# Patient Record
Sex: Female | Born: 1955 | Race: White | Hispanic: No | Marital: Single | Smoking: Current every day smoker
Health system: Southern US, Community
[De-identification: ages and names within clinical notes are randomized; demographics above are authoritative.]

## PROBLEM LIST (undated history)

## (undated) DIAGNOSIS — Z973 Presence of spectacles and contact lenses: Secondary | ICD-10-CM

## (undated) DIAGNOSIS — I10 Essential (primary) hypertension: Secondary | ICD-10-CM

## (undated) DIAGNOSIS — C801 Malignant (primary) neoplasm, unspecified: Secondary | ICD-10-CM

## (undated) HISTORY — PX: CHOLECYSTECTOMY: SHX55

## (undated) HISTORY — PX: ABDOMINAL HYSTERECTOMY: SHX81

## (undated) HISTORY — PX: NASAL SINUS SURGERY: SHX719

---

## 2010-12-15 DIAGNOSIS — I10 Essential (primary) hypertension: Secondary | ICD-10-CM | POA: Insufficient documentation

## 2010-12-15 DIAGNOSIS — D539 Nutritional anemia, unspecified: Secondary | ICD-10-CM | POA: Insufficient documentation

## 2010-12-15 DIAGNOSIS — IMO0002 Reserved for concepts with insufficient information to code with codable children: Secondary | ICD-10-CM | POA: Insufficient documentation

## 2010-12-15 DIAGNOSIS — M8580 Other specified disorders of bone density and structure, unspecified site: Secondary | ICD-10-CM | POA: Insufficient documentation

## 2011-05-24 ENCOUNTER — Ambulatory Visit: Payer: Self-pay

## 2011-12-09 ENCOUNTER — Ambulatory Visit: Payer: Self-pay | Admitting: Family Medicine

## 2015-11-03 ENCOUNTER — Encounter
Admission: RE | Disposition: A | Discharge status: Home Health | Payer: Self-pay | Source: Ambulatory Visit | Attending: Unknown Physician Specialty

## 2015-11-03 ENCOUNTER — Ambulatory Visit: Payer: 59 | Admitting: Anesthesiology

## 2015-11-03 ENCOUNTER — Encounter: Payer: Self-pay | Admitting: *Deleted

## 2015-11-03 ENCOUNTER — Ambulatory Visit
Admission: RE | Admit: 2015-11-03 | Discharge: 2015-11-03 | Disposition: A | Discharge status: Home Health | Payer: 59 | Source: Ambulatory Visit | Attending: Unknown Physician Specialty | Admitting: Unknown Physician Specialty

## 2015-11-03 DIAGNOSIS — K635 Polyp of colon: Secondary | ICD-10-CM | POA: Diagnosis not present

## 2015-11-03 DIAGNOSIS — K640 First degree hemorrhoids: Secondary | ICD-10-CM | POA: Insufficient documentation

## 2015-11-03 DIAGNOSIS — Z9049 Acquired absence of other specified parts of digestive tract: Secondary | ICD-10-CM | POA: Diagnosis not present

## 2015-11-03 DIAGNOSIS — D124 Benign neoplasm of descending colon: Secondary | ICD-10-CM | POA: Diagnosis not present

## 2015-11-03 DIAGNOSIS — Z800 Family history of malignant neoplasm of digestive organs: Secondary | ICD-10-CM | POA: Diagnosis present

## 2015-11-03 DIAGNOSIS — Z90710 Acquired absence of both cervix and uterus: Secondary | ICD-10-CM | POA: Insufficient documentation

## 2015-11-03 DIAGNOSIS — F17210 Nicotine dependence, cigarettes, uncomplicated: Secondary | ICD-10-CM | POA: Insufficient documentation

## 2015-11-03 DIAGNOSIS — I10 Essential (primary) hypertension: Secondary | ICD-10-CM | POA: Diagnosis not present

## 2015-11-03 DIAGNOSIS — Z86010 Personal history of colonic polyps: Secondary | ICD-10-CM | POA: Insufficient documentation

## 2015-11-03 DIAGNOSIS — Z1211 Encounter for screening for malignant neoplasm of colon: Secondary | ICD-10-CM | POA: Insufficient documentation

## 2015-11-03 DIAGNOSIS — Z9889 Other specified postprocedural states: Secondary | ICD-10-CM | POA: Diagnosis not present

## 2015-11-03 DIAGNOSIS — Z7984 Long term (current) use of oral hypoglycemic drugs: Secondary | ICD-10-CM | POA: Diagnosis not present

## 2015-11-03 HISTORY — DX: Essential (primary) hypertension: I10

## 2015-11-03 HISTORY — PX: COLONOSCOPY WITH PROPOFOL: SHX5780

## 2015-11-03 SURGERY — COLONOSCOPY WITH PROPOFOL
Anesthesia: General

## 2015-11-03 MED ORDER — PROPOFOL 500 MG/50ML IV EMUL
INTRAVENOUS | Status: DC | PRN
  Administered 2015-11-03: 130 ug/kg/min via INTRAVENOUS

## 2015-11-03 MED ORDER — SODIUM CHLORIDE 0.9 % IV SOLN: INTRAVENOUS | Status: DC

## 2015-11-03 MED ORDER — MIDAZOLAM HCL 2 MG/2ML IJ SOLN
INTRAMUSCULAR | Status: DC | PRN
  Administered 2015-11-03: 1 mg via INTRAVENOUS

## 2015-11-03 MED ORDER — SODIUM CHLORIDE 0.9 % IV SOLN
INTRAVENOUS | Status: DC
  Administered 2015-11-03: 13:00:00 via INTRAVENOUS

## 2015-11-03 MED ORDER — LIDOCAINE HCL (CARDIAC) 20 MG/ML IV SOLN
INTRAVENOUS | Status: DC | PRN
  Administered 2015-11-03: 30 mg via INTRAVENOUS

## 2015-11-03 MED ORDER — PROPOFOL 10 MG/ML IV BOLUS
INTRAVENOUS | Status: DC | PRN
  Administered 2015-11-03: 30 mg via INTRAVENOUS
  Administered 2015-11-03: 20 mg via INTRAVENOUS
  Administered 2015-11-03: 30 mg via INTRAVENOUS
  Administered 2015-11-03 (×2): 20 mg via INTRAVENOUS

## 2015-11-03 NOTE — Transfer of Care (Signed)
Immediate Anesthesia Transfer of Care Note  Patient: Vanessa Dougherty  Procedure(s) Performed: Procedure(s): COLONOSCOPY WITH PROPOFOL (N/A)  Patient Location: PACU  Anesthesia Type:General  Level of Consciousness: awake and alert   Airway & Oxygen Therapy: Patient Spontanous Breathing and Patient connected to nasal cannula oxygen  Post-op Assessment: Report given to RN and Post -op Vital signs reviewed and stable  Post vital signs: Reviewed and stable  Last Vitals:  Filed Vitals:   11/03/15 1234 11/03/15 1415  BP: 120/80 116/85  Pulse: 80 66  Temp: 35.9 C 35.6 C  Resp: 20 23    Last Pain: There were no vitals filed for this visit.       Complications: No apparent anesthesia complications

## 2015-11-03 NOTE — H&P (Signed)
   Primary Care Physician:  [place] Primary Gastroenterologist:  Dr. Vira Agar  Pre-Procedure History & Physical: HPI:  Vanessa Dougherty is a 60 y.o. female is here for an colonoscopy.   Past Medical History  Diagnosis Date  . Hypertension     Past Surgical History  Procedure Laterality Date  . Abdominal hysterectomy    . Cholecystectomy    . Nasal sinus surgery      Prior to Admission medications   Medication Sig Start Date End Date Taking? Authorizing Provider  valsartan-hydrochlorothiazide (DIOVAN-HCT) 80-12.5 MG tablet Take 1 tablet by mouth daily.   Yes Historical Provider, MD    Allergies as of 10/22/2015  . (Not on File)    History reviewed. No pertinent family history.  Social History   Social History  . Marital Status: Single    Spouse Name: N/A  . Number of Children: N/A  . Years of Education: N/A   Occupational History  . Not on file.   Social History Main Topics  . Smoking status: Current Every Day Smoker -- 0.50 packs/day for 39 years    Types: Cigarettes  . Smokeless tobacco: Not on file  . Alcohol Use: 1.8 oz/week    3 Glasses of wine per week  . Drug Use: No  . Sexual Activity: Not on file   Other Topics Concern  . Not on file   Social History Narrative  . No narrative on file    Review of Systems: See HPI, otherwise negative ROS  Physical Exam: BP 120/80 mmHg  Pulse 80  Temp(Src) 96.6 F (35.9 C) (Tympanic)  Resp 20  Ht 5\' 5"  (1.651 m)  Wt 71.668 kg (158 lb)  BMI 26.29 kg/m2  SpO2 98% General:   Alert,  pleasant and cooperative in NAD Head:  Normocephalic and atraumatic. Neck:  Supple; no masses or thyromegaly. Lungs:  Clear throughout to auscultation.    Heart:  Regular rate and rhythm. Abdomen:  Soft, nontender and nondistended. Normal bowel sounds, without guarding, and without rebound.   Neurologic:  Alert and  oriented x4;  grossly normal neurologically.  Impression/Plan: Vanessa Dougherty is here for  an colonoscopy to be performed for [facility] colon polyps and FH colon cancer in father  Risks, benefits, limitations, and alternatives regarding  colonoscopy have been reviewed with the patient.  Questions have been answered.  All parties agreeable.   Gaylyn Cheers, MD  11/03/2015, 1:27 PM

## 2015-11-03 NOTE — Anesthesia Preprocedure Evaluation (Signed)
Anesthesia Evaluation  Patient identified by MRN, date of birth, ID band Patient awake    Reviewed: Allergy & Precautions, H&P , NPO status , Patient's Chart, lab work & pertinent test results, reviewed documented beta blocker date and time   Airway Mallampati: II   Neck ROM: full    Dental  (+) Poor Dentition   Pulmonary neg pulmonary ROS, Current Smoker,    Pulmonary exam normal        Cardiovascular hypertension, negative cardio ROS Normal cardiovascular exam     Neuro/Psych negative neurological ROS  negative psych ROS   GI/Hepatic negative GI ROS, Neg liver ROS,   Endo/Other  negative endocrine ROS  Renal/GU negative Renal ROS  negative genitourinary   Musculoskeletal   Abdominal   Peds  Hematology negative hematology ROS (+)   Anesthesia Other Findings Past Medical History:   Hypertension                                               Past Surgical History:   ABDOMINAL HYSTERECTOMY                                        CHOLECYSTECTOMY                                               NASAL SINUS SURGERY                                         BMI    Body Mass Index   26.29 kg/m 2     Reproductive/Obstetrics                             Anesthesia Physical Anesthesia Plan  ASA: II  Anesthesia Plan: General   Post-op Pain Management:    Induction:   Airway Management Planned:   Additional Equipment:   Intra-op Plan:   Post-operative Plan:   Informed Consent: I have reviewed the patients History and Physical, chart, labs and discussed the procedure including the risks, benefits and alternatives for the proposed anesthesia with the patient or authorized representative who has indicated his/her understanding and acceptance.   Dental Advisory Given  Plan Discussed with: CRNA  Anesthesia Plan Comments:         Anesthesia Quick Evaluation

## 2015-11-03 NOTE — Op Note (Signed)
[facility] Gastroenterology Patient Name: Vanessa Dougherty Procedure Date: 11/03/2015 1:27 PM MRN: [id number] Account #: [id number] Date of Birth: [birth year] Admit Type: Outpatient Age: 60 Room: [facility] ENDO ROOM 1 Gender: Female Note Status: Finalized Procedure:            Colonoscopy Indications:          High risk colon cancer surveillance: Personal history                        of colonic polyps, Family history of colon cancer in a                        first-degree relative Providers:            Manya Silvas, MD Referring MD:         Wynona Canes. Kym Groom, MD (Referring MD) Medicines:            Propofol per Anesthesia Complications:        No immediate complications. Procedure:            Pre-Anesthesia Assessment:                       - After reviewing the risks and benefits, the patient                        was deemed in satisfactory condition to undergo the                        procedure.                       After obtaining informed consent, the colonoscope was                        passed under direct vision. Throughout the procedure,                        the patient's blood pressure, pulse, and oxygen                        saturations were monitored continuously. The                        Colonoscope was introduced through the anus and                        advanced to the the cecum, identified by appendiceal                        orifice and ileocecal valve. The colonoscopy was                        performed with moderate difficulty due to restricted                        mobility of the colon, significant looping and a                        tortuous colon. Successful completion of the procedure  was aided by applying abdominal pressure. The patient                        tolerated the procedure well. The quality of the bowel                        preparation was excellent. Findings:      A small black nevus seen  on buttock.      A diminutive polyp was found in the recto-sigmoid colon. The polyp was       sessile. The polyp was removed with a jumbo cold forceps. Resection and       retrieval were complete.      Two sessile polyps were found in the distal descending colon. The polyps       were diminutive in size. These polyps were removed with a jumbo cold       forceps. Resection and retrieval were complete.      Two sessile polyps were found in the sigmoid colon. The polyps were       diminutive in size. These polyps were removed with a jumbo cold forceps.       Resection and retrieval were complete.      A small polyp was found in the recto-sigmoid colon. The polyp was       sessile. The polyp was removed with a hot snare. Resection and retrieval       were complete.      Internal hemorrhoids were found during endoscopy. The hemorrhoids were       small and Grade I (internal hemorrhoids that do not prolapse). Impression:           - One diminutive polyp at the recto-sigmoid colon,                        removed with a jumbo cold forceps. Resected and                        retrieved.                       - Two diminutive polyps in the distal descending colon,                        removed with a jumbo cold forceps. Resected and                        retrieved.                       - Two diminutive polyps in the sigmoid colon, removed                        with a jumbo cold forceps. Resected and retrieved.                       - One small polyp at the recto-sigmoid colon, removed                        with a hot snare. Resected and retrieved.                       - Internal hemorrhoids. Recommendation:       -  Await pathology results. Manya Silvas, MD 11/03/2015 2:15:04 PM This report has been signed electronically. Number of Addenda: 0 Note Initiated On: 11/03/2015 1:27 PM Scope Withdrawal Time: 0 hours 18 minutes 24 seconds  Total Procedure Duration: 0 hours 36 minutes 32 seconds        [facility]

## 2015-11-03 NOTE — Anesthesia Procedure Notes (Signed)
Date/Time: 11/03/2015 1:29 PM Performed by: Johnna Acosta Pre-anesthesia Checklist: Patient identified, Emergency Drugs available, Suction available, Patient being monitored and Timeout performed Patient Re-evaluated:Patient Re-evaluated prior to inductionOxygen Delivery Method: Nasal cannula

## 2015-11-04 ENCOUNTER — Encounter: Payer: Self-pay | Admitting: Unknown Physician Specialty

## 2015-11-05 LAB — SURGICAL PATHOLOGY

## 2015-11-05 NOTE — Anesthesia Postprocedure Evaluation (Signed)
Anesthesia Post Note  Patient: Vanessa Dougherty  Procedure(s) Performed: Procedure(s) (LRB): COLONOSCOPY WITH PROPOFOL (N/A)  Patient location during evaluation: PACU Anesthesia Type: General Level of consciousness: awake and alert and oriented Pain management: pain level controlled Vital Signs Assessment: post-procedure vital signs reviewed and stable Respiratory status: spontaneous breathing Cardiovascular status: blood pressure returned to baseline Anesthetic complications: no    Last Vitals:  Filed Vitals:   11/03/15 1436 11/03/15 1446  BP: 118/80 110/83  Pulse: 61 60  Temp:    Resp: 9 14    Last Pain: There were no vitals filed for this visit.               Vera Wishart

## 2016-10-28 ENCOUNTER — Other Ambulatory Visit: Payer: Self-pay | Admitting: Family Medicine

## 2016-10-28 DIAGNOSIS — Z1231 Encounter for screening mammogram for malignant neoplasm of breast: Secondary | ICD-10-CM

## 2016-11-02 ENCOUNTER — Other Ambulatory Visit: Payer: Self-pay | Admitting: Family Medicine

## 2016-11-02 ENCOUNTER — Ambulatory Visit
Admission: RE | Admit: 2016-11-02 | Discharge: 2016-11-02 | Disposition: A | Discharge status: Home / Self Care | Payer: 59 | Source: Ambulatory Visit | Attending: Family Medicine | Admitting: Family Medicine

## 2016-11-02 DIAGNOSIS — Z1231 Encounter for screening mammogram for malignant neoplasm of breast: Secondary | ICD-10-CM | POA: Insufficient documentation

## 2016-11-02 HISTORY — DX: Malignant (primary) neoplasm, unspecified: C80.1

## 2016-11-03 ENCOUNTER — Other Ambulatory Visit: Payer: Self-pay | Admitting: *Deleted

## 2016-11-03 ENCOUNTER — Inpatient Hospital Stay
Admission: RE | Admit: 2016-11-03 | Discharge: 2016-11-03 | Disposition: A | Discharge status: Home / Self Care | Payer: Self-pay | Source: Ambulatory Visit | Attending: *Deleted | Admitting: *Deleted

## 2016-11-03 DIAGNOSIS — Z9289 Personal history of other medical treatment: Secondary | ICD-10-CM

## 2016-11-05 ENCOUNTER — Other Ambulatory Visit: Payer: Self-pay | Admitting: Family Medicine

## 2016-11-05 DIAGNOSIS — R921 Mammographic calcification found on diagnostic imaging of breast: Secondary | ICD-10-CM

## 2016-11-05 DIAGNOSIS — R928 Other abnormal and inconclusive findings on diagnostic imaging of breast: Secondary | ICD-10-CM

## 2016-11-11 ENCOUNTER — Ambulatory Visit
Admission: RE | Admit: 2016-11-11 | Discharge: 2016-11-11 | Disposition: A | Discharge status: Home / Self Care | Payer: 59 | Source: Ambulatory Visit | Attending: Family Medicine | Admitting: Family Medicine

## 2016-11-11 DIAGNOSIS — R928 Other abnormal and inconclusive findings on diagnostic imaging of breast: Secondary | ICD-10-CM

## 2016-11-11 DIAGNOSIS — R921 Mammographic calcification found on diagnostic imaging of breast: Secondary | ICD-10-CM | POA: Diagnosis present

## 2016-11-12 ENCOUNTER — Other Ambulatory Visit: Payer: Self-pay | Admitting: Family Medicine

## 2016-11-12 DIAGNOSIS — R928 Other abnormal and inconclusive findings on diagnostic imaging of breast: Secondary | ICD-10-CM

## 2016-11-12 DIAGNOSIS — R921 Mammographic calcification found on diagnostic imaging of breast: Secondary | ICD-10-CM

## 2016-11-16 ENCOUNTER — Ambulatory Visit
Admission: RE | Admit: 2016-11-16 | Discharge: 2016-11-16 | Disposition: A | Discharge status: Home / Self Care | Payer: 59 | Source: Ambulatory Visit | Attending: Family Medicine | Admitting: Family Medicine

## 2016-11-16 DIAGNOSIS — R921 Mammographic calcification found on diagnostic imaging of breast: Secondary | ICD-10-CM

## 2016-11-16 DIAGNOSIS — R928 Other abnormal and inconclusive findings on diagnostic imaging of breast: Secondary | ICD-10-CM

## 2016-11-16 HISTORY — PX: BREAST BIOPSY: SHX20

## 2016-11-17 LAB — SURGICAL PATHOLOGY

## 2017-08-29 DIAGNOSIS — M7061 Trochanteric bursitis, right hip: Secondary | ICD-10-CM | POA: Insufficient documentation

## 2020-03-07 ENCOUNTER — Other Ambulatory Visit: Payer: Self-pay | Admitting: Family Medicine

## 2020-03-07 DIAGNOSIS — Z1231 Encounter for screening mammogram for malignant neoplasm of breast: Secondary | ICD-10-CM

## 2020-03-18 ENCOUNTER — Inpatient Hospital Stay: Admission: RE | Admit: 2020-03-18 | Payer: 59 | Source: Ambulatory Visit

## 2021-01-03 ENCOUNTER — Other Ambulatory Visit: Payer: Self-pay

## 2021-01-03 ENCOUNTER — Ambulatory Visit
Admission: EM | Admit: 2021-01-03 | Discharge: 2021-01-03 | Disposition: A | Discharge status: Home / Self Care | Payer: Managed Care, Other (non HMO) | Attending: Physician Assistant | Admitting: Physician Assistant

## 2021-01-03 DIAGNOSIS — W5501XA Bitten by cat, initial encounter: Secondary | ICD-10-CM | POA: Diagnosis not present

## 2021-01-03 DIAGNOSIS — L03114 Cellulitis of left upper limb: Secondary | ICD-10-CM | POA: Diagnosis not present

## 2021-01-03 MED ORDER — CEPHALEXIN 500 MG PO CAPS: 500.0000 mg | ORAL_CAPSULE | Freq: Four times a day (QID) | ORAL | 0 refills | Status: AC

## 2021-01-03 MED ORDER — DOXYCYCLINE HYCLATE 100 MG PO CAPS: 100.0000 mg | ORAL_CAPSULE | Freq: Two times a day (BID) | ORAL | 0 refills | Status: AC

## 2021-01-03 MED ORDER — MUPIROCIN 2 % EX OINT: 1.0000 "application " | TOPICAL_OINTMENT | Freq: Three times a day (TID) | CUTANEOUS | 0 refills | Status: AC

## 2021-01-03 NOTE — ED Triage Notes (Signed)
Pt states she was bit by a cat on right wrist on Thursday. Cat is her sons cat, Rabies are up to date. Did clean wound with soap and water. Pt states it didn't start hurting until Yesterday morning. Did take motrin for pain this morning with little help.

## 2021-01-03 NOTE — Discharge Instructions (Addendum)
As we discussed cat bites have a high rate of getting infected and your cat bite to get infected.  I have sent 2 different antibiotics since you have swelling and redness around your wrist.  You can ice this area and keep it elevated.  Clean it with soap and water.  I have also sent an ointment.  Keep a close eye on this area and if you develop any spreading redness or swelling or develop a fever or increased pain you need to be seen again immediately.  For any severe acute worsening of symptoms, please go to the emergency department.

## 2021-01-03 NOTE — ED Provider Notes (Signed)
[facility]    CSN: [id number] Arrival date & time: 01/03/21  1330      History   Chief Complaint Chief Complaint  Patient presents with   Animal Bite   Wrist Pain    right    HPI Vanessa Dougherty is a 65 y.o. female presenting for concerns about infection following a cat bite 2 days ago.  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  She has 3 puncture wounds of the wrist.  She says that she has had increased swelling and pain around the wrist joint as well as redness of this area.  Patient has applied ice and taken ibuprofen for pain and also try to keep the area clean.  States she applied hydrogen peroxide initially.  She is up-to-date with tetanus vaccines.  Patient states that the cat is up-to-date with its vaccinations.  She denies any fevers and has not had any pustular drainage.  No other complaints or concerns.  HPI  Past Medical History:  Diagnosis Date   Cancer ([place])    melanoma   Hypertension     There are no problems to display for this patient.   Past Surgical History:  Procedure Laterality Date   ABDOMINAL HYSTERECTOMY     BREAST BIOPSY Left 11/16/2016   Affirm biopsy of 2 areas- path pending   CHOLECYSTECTOMY     COLONOSCOPY WITH PROPOFOL N/A 11/03/2015   Procedure: COLONOSCOPY WITH PROPOFOL;  Surgeon: Manya Silvas, MD;  Location: [facility] ENDOSCOPY;  Service: Endoscopy;  Laterality: N/A;   NASAL SINUS SURGERY      OB History   No obstetric history on file.      Home Medications    Prior to Admission medications   Medication Sig Start Date End Date Taking? Authorizing Provider  cephALEXin (KEFLEX) 500 MG capsule Take 1 capsule (500 mg total) by mouth 4 (four) times daily for 7 days. 01/03/21 01/10/21 Yes Danton Clap, PA-C  doxycycline (VIBRAMYCIN) 100 MG capsule Take 1 capsule (100 mg total) by mouth 2 (two) times daily for 7 days. 01/03/21 01/10/21 Yes  Danton Clap, PA-C  mupirocin ointment (BACTROBAN) 2 % Apply 1 application topically 3 (three) times daily. 01/03/21  Yes Laurene Footman B, PA-C  valsartan-hydrochlorothiazide (DIOVAN-HCT) 80-12.5 MG tablet Take 1 tablet by mouth daily.   Yes [provider]    Family History Family History  Problem Relation Age of Onset   Breast cancer Neg Hx     Social History Social History   Tobacco Use   Smoking status: Every Day    Packs/day: 0.50    Years: 39.00    Pack years: 19.50    Types: Cigarettes   Smokeless tobacco: Never  Substance Use Topics   Alcohol use: Yes    Alcohol/week: 3.0 standard drinks    Types: 3 Glasses of wine per week   Drug use: No     Allergies   Augmentin [amoxicillin-pot clavulanate] and Phenylpropanolamine   Review of Systems Review of Systems  Constitutional:  Negative for fatigue and fever.  Musculoskeletal:  Positive for arthralgias. Negative for joint swelling.  Skin:  Positive for color change and wound.  Neurological:  Negative for weakness and numbness.    Physical Exam Triage Vital Signs ED Triage Vitals  Enc Vitals Group     BP 01/03/21 1347 (!) 112/59     Pulse Rate 01/03/21 1347 66  Resp 01/03/21 1347 16     Temp 01/03/21 1347 98.5 F (36.9 C)     Temp Source 01/03/21 1347 Oral     SpO2 01/03/21 1347 98 %     Weight 01/03/21 1346 160 lb (72.6 kg)     Height 01/03/21 1346 '5\' 5"'$  (1.651 m)     Head Circumference --      Peak Flow --      Pain Score 01/03/21 1345 8     Pain Loc --      Pain Edu? --      Excl. in [place]? --    No data found.  Updated Vital Signs BP (!) 112/59 (BP Location: Left Arm)   Pulse 66   Temp 98.5 F (36.9 C) (Oral)   Resp 16   Ht '5\' 5"'$  (1.651 m)   Wt 160 lb (72.6 kg)   SpO2 98%   BMI 26.63 kg/m      Physical Exam Vitals and nursing note reviewed.  Constitutional:      General: She is not in acute distress.    Appearance: Normal appearance. She is not ill-appearing or  toxic-appearing.  HENT:     Head: Normocephalic and atraumatic.  Eyes:     General: No scleral icterus.       Right eye: No discharge.        Left eye: No discharge.     Conjunctiva/sclera: Conjunctivae normal.  Cardiovascular:     Rate and Rhythm: Normal rate and regular rhythm.     Heart sounds: Normal heart sounds.  Pulmonary:     Effort: Pulmonary effort is normal. No respiratory distress.     Breath sounds: Normal breath sounds.  Musculoskeletal:     Cervical back: Neck supple.  Skin:    General: Skin is dry.     Comments: See image below.  There are 3 puncture wounds, 2 on the dorsal wrist.  One has a small pustule forming.  There is erythema, swelling and increased warmth of the wrist as well circumferentially.  Area is diffusely tender to palpation.  FROM of her wrist.  Good strength and sensation.  Neurological:     General: No focal deficit present.     Mental Status: She is alert. Mental status is at baseline.     Motor: No weakness.     Gait: Gait normal.  Psychiatric:        Mood and Affect: Mood normal.        Behavior: Behavior normal.        Thought Content: Thought content normal.        UC Treatments / Results  Labs (all labs ordered are listed, but only abnormal results are displayed) Labs Reviewed - No data to display  EKG   Radiology No results found.  Procedures Procedures (including critical care time)  Medications Ordered in UC Medications - No data to display  Initial Impression / Assessment and Plan / UC Course  I have reviewed the triage vital signs and the nursing notes.  Pertinent labs & imaging results that were available during my care of the patient were reviewed by me and considered in my medical decision making (see chart for details).  65 year old female presenting for puncture wounds following cat bite as well as a pustule of the wrist, increased redness, swelling and pain.  Photos taken above to document.  She has clear  cellulitis and a pustule.  This has been reported to animal control in  [place] via fax.  Patient being treated at this time with doxycycline and Keflex.  She states that she is not allergic to Augmentin but does not want to take it because her mother experienced onset of an autoimmune disease that eventually led to her death.  Advised patient on supportive care and close monitoring and reviewed ED precautions with her.   Final Clinical Impressions(s) / UC Diagnoses   Final diagnoses:  Cellulitis of left wrist  Cat bite, initial encounter     Discharge Instructions      As we discussed cat bites have a high rate of getting infected and your cat bite to get infected.  I have sent 2 different antibiotics since you have swelling and redness around your wrist.  You can ice this area and keep it elevated.  Clean it with soap and water.  I have also sent an ointment.  Keep a close eye on this area and if you develop any spreading redness or swelling or develop a fever or increased pain you need to be seen again immediately.  For any severe acute worsening of symptoms, please go to the emergency department.     ED Prescriptions     Medication Sig Dispense Auth. Provider   doxycycline (VIBRAMYCIN) 100 MG capsule Take 1 capsule (100 mg total) by mouth 2 (two) times daily for 7 days. 14 capsule Laurene Footman B, PA-C   cephALEXin (KEFLEX) 500 MG capsule Take 1 capsule (500 mg total) by mouth 4 (four) times daily for 7 days. 28 capsule Laurene Footman B, PA-C   mupirocin ointment (BACTROBAN) 2 % Apply 1 application topically 3 (three) times daily. 22 g Danton Clap, PA-C      PDMP not reviewed this encounter.   Danton Clap, PA-C 01/03/21 1521

## 2021-03-02 ENCOUNTER — Other Ambulatory Visit: Payer: Self-pay | Admitting: Family Medicine

## 2021-03-02 DIAGNOSIS — Z1231 Encounter for screening mammogram for malignant neoplasm of breast: Secondary | ICD-10-CM

## 2021-03-17 ENCOUNTER — Other Ambulatory Visit: Payer: Self-pay

## 2021-03-17 ENCOUNTER — Ambulatory Visit
Admission: RE | Admit: 2021-03-17 | Discharge: 2021-03-17 | Disposition: A | Discharge status: Home / Self Care | Payer: Managed Care, Other (non HMO) | Source: Ambulatory Visit | Attending: Family Medicine | Admitting: Family Medicine

## 2021-03-17 DIAGNOSIS — Z1231 Encounter for screening mammogram for malignant neoplasm of breast: Secondary | ICD-10-CM | POA: Insufficient documentation

## 2021-03-27 ENCOUNTER — Other Ambulatory Visit: Payer: Self-pay

## 2021-03-27 DIAGNOSIS — Z800 Family history of malignant neoplasm of digestive organs: Secondary | ICD-10-CM

## 2021-03-27 DIAGNOSIS — Z1211 Encounter for screening for malignant neoplasm of colon: Secondary | ICD-10-CM

## 2021-03-27 NOTE — Progress Notes (Signed)
Gastroenterology Pre-Procedure Review  Request Date: 04/17/21 Requesting Physician: Dr. Allen Norris  PATIENT REVIEW QUESTIONS: The patient responded to the following health history questions as indicated:  Patient prefers to use the Miralax/Ducolax Prep Method.  1. Are you having any GI issues? no 2. Do you have a personal history of Polyps? yes (11/03/2015 polyps removed.) 3. Do you have a family history of Colon Cancer or Polyps? yes (Father- colon cancer) 4. Diabetes Mellitus? no 5. Joint replacements in the past 12 months?no 6. Major health problems in the past 3 months?no 7. Any artificial heart valves, MVP, or defibrillator?no    MEDICATIONS & ALLERGIES:    Patient reports the following regarding taking any anticoagulation/antiplatelet therapy:   Plavix, Coumadin, Eliquis, Xarelto, Lovenox, Pradaxa, Brilinta, or Effient? no Aspirin? no  Patient confirms/reports the following medications:  Current Outpatient Medications  Medication Sig Dispense Refill   mupirocin ointment (BACTROBAN) 2 % Apply 1 application topically 3 (three) times daily. 22 g 0   valsartan-hydrochlorothiazide (DIOVAN-HCT) 80-12.5 MG tablet Take 1 tablet by mouth daily.     No current facility-administered medications for this visit.    Patient confirms/reports the following allergies:  Allergies  Allergen Reactions   Augmentin [Amoxicillin-Pot Clavulanate]     Mother died from a reaction to this and she is afraid to try it    Phenylpropanolamine     bigeminy    No orders of the defined types were placed in this encounter.   AUTHORIZATION INFORMATION Primary Insurance: 1D#: Group #:  Secondary Insurance: 1D#: Group #:  SCHEDULE INFORMATION: Date: 04/17/21  Time: Location: [place]

## 2021-04-07 ENCOUNTER — Encounter: Payer: Self-pay | Admitting: Gastroenterology

## 2021-04-08 ENCOUNTER — Encounter: Payer: Self-pay | Admitting: Gastroenterology

## 2021-04-17 ENCOUNTER — Other Ambulatory Visit: Payer: Self-pay

## 2021-04-17 ENCOUNTER — Ambulatory Visit
Admission: RE | Admit: 2021-04-17 | Discharge: 2021-04-17 | Disposition: A | Discharge status: Home / Self Care | Payer: Managed Care, Other (non HMO) | Attending: Gastroenterology | Admitting: Gastroenterology

## 2021-04-17 ENCOUNTER — Encounter: Payer: Self-pay | Admitting: Gastroenterology

## 2021-04-17 ENCOUNTER — Ambulatory Visit: Payer: Managed Care, Other (non HMO) | Admitting: Anesthesiology

## 2021-04-17 ENCOUNTER — Encounter
Admission: RE | Disposition: A | Discharge status: Home / Self Care | Payer: Self-pay | Source: Home / Self Care | Attending: Gastroenterology

## 2021-04-17 DIAGNOSIS — K529 Noninfective gastroenteritis and colitis, unspecified: Secondary | ICD-10-CM | POA: Insufficient documentation

## 2021-04-17 DIAGNOSIS — Z800 Family history of malignant neoplasm of digestive organs: Secondary | ICD-10-CM | POA: Diagnosis not present

## 2021-04-17 DIAGNOSIS — K635 Polyp of colon: Secondary | ICD-10-CM | POA: Insufficient documentation

## 2021-04-17 DIAGNOSIS — I10 Essential (primary) hypertension: Secondary | ICD-10-CM | POA: Insufficient documentation

## 2021-04-17 DIAGNOSIS — F17210 Nicotine dependence, cigarettes, uncomplicated: Secondary | ICD-10-CM | POA: Insufficient documentation

## 2021-04-17 DIAGNOSIS — Z1211 Encounter for screening for malignant neoplasm of colon: Secondary | ICD-10-CM | POA: Insufficient documentation

## 2021-04-17 DIAGNOSIS — D122 Benign neoplasm of ascending colon: Secondary | ICD-10-CM | POA: Insufficient documentation

## 2021-04-17 DIAGNOSIS — D124 Benign neoplasm of descending colon: Secondary | ICD-10-CM | POA: Insufficient documentation

## 2021-04-17 DIAGNOSIS — K641 Second degree hemorrhoids: Secondary | ICD-10-CM | POA: Diagnosis not present

## 2021-04-17 DIAGNOSIS — Z860100 Personal history of colon polyps, unspecified: Secondary | ICD-10-CM

## 2021-04-17 HISTORY — DX: Presence of spectacles and contact lenses: Z97.3

## 2021-04-17 HISTORY — PX: COLONOSCOPY WITH PROPOFOL: SHX5780

## 2021-04-17 HISTORY — PX: POLYPECTOMY: SHX5525

## 2021-04-17 SURGERY — COLONOSCOPY WITH PROPOFOL
Anesthesia: General | Site: Rectum

## 2021-04-17 MED ORDER — STERILE WATER FOR IRRIGATION IR SOLN
Status: DC | PRN
  Administered 2021-04-17: 1

## 2021-04-17 MED ORDER — PROPOFOL 10 MG/ML IV BOLUS
INTRAVENOUS | Status: DC | PRN
  Administered 2021-04-17 (×2): 30 mg via INTRAVENOUS
  Administered 2021-04-17: 40 mg via INTRAVENOUS
  Administered 2021-04-17 (×2): 30 mg via INTRAVENOUS
  Administered 2021-04-17: 70 mg via INTRAVENOUS
  Administered 2021-04-17 (×2): 30 mg via INTRAVENOUS

## 2021-04-17 MED ORDER — OXYCODONE HCL 5 MG PO TABS: 5.0000 mg | ORAL_TABLET | Freq: Once | ORAL | Status: DC | PRN

## 2021-04-17 MED ORDER — SODIUM CHLORIDE 0.9 % IV SOLN: INTRAVENOUS | Status: DC

## 2021-04-17 MED ORDER — OXYCODONE HCL 5 MG/5ML PO SOLN: 5.0000 mg | Freq: Once | ORAL | Status: DC | PRN

## 2021-04-17 MED ORDER — LACTATED RINGERS IV SOLN: INTRAVENOUS | Status: DC

## 2021-04-17 SURGICAL SUPPLY — 7 items
FORCEPS BIOP RAD 4 LRG CAP 4 (CUTTING FORCEPS) ×2 IMPLANT
GOWN CVR UNV OPN BCK APRN NK (MISCELLANEOUS) ×2 IMPLANT
GOWN ISOL THUMB LOOP REG UNIV (MISCELLANEOUS) ×4
KIT PRC NS LF DISP ENDO (KITS) ×1 IMPLANT
KIT PROCEDURE OLYMPUS (KITS) ×2
MANIFOLD NEPTUNE II (INSTRUMENTS) ×2 IMPLANT
WATER STERILE IRR 250ML POUR (IV SOLUTION) ×2 IMPLANT

## 2021-04-17 NOTE — Transfer of Care (Signed)
Immediate Anesthesia Transfer of Care Note  Patient: Vanessa Dougherty  Procedure(s) Performed: COLONOSCOPY WITH BIOPSY (Rectum) POLYPECTOMY (Rectum)  Patient Location: PACU  Anesthesia Type: General  Level of Consciousness: awake, alert  and patient cooperative  Airway and Oxygen Therapy: Patient Spontanous Breathing and Patient connected to supplemental oxygen  Post-op Assessment: Post-op Vital signs reviewed, Patient's Cardiovascular Status Stable, Respiratory Function Stable, Patent Airway and No signs of Nausea or vomiting  Post-op Vital Signs: Reviewed and stable  Complications: No notable events documented.

## 2021-04-17 NOTE — Anesthesia Procedure Notes (Signed)
Date/Time: 04/17/2021 9:59 AM Performed by: Dionne Bucy, CRNA Pre-anesthesia Checklist: Patient identified, Emergency Drugs available, Suction available, Patient being monitored and Timeout performed Patient Re-evaluated:Patient Re-evaluated prior to induction Oxygen Delivery Method: Nasal cannula Placement Confirmation: positive ETCO2

## 2021-04-17 NOTE — Op Note (Signed)
[facility] Gastroenterology Patient Name: Vanessa Dougherty Procedure Date: 04/17/2021 9:51 AM MRN: [id number] Account #: [id number] Date of Birth: 05/24/1956 Admit Type: Outpatient Age: 65 Room: [facility] OR ROOM 01 Gender: Female Note Status: Finalized Instrument Name: [id number] Procedure:             Colonoscopy Indications:           High risk colon cancer surveillance: Personal history                         of colonic polyps Providers:             Lucilla Lame MD, MD Referring MD:          Wynona Canes. Kym Groom, MD (Referring MD) Medicines:             Propofol per Anesthesia Complications:         No immediate complications. Procedure:             Pre-Anesthesia Assessment:                        - Prior to the procedure, a History and Physical was                         performed, and patient medications and allergies were                         reviewed. The patient's tolerance of previous                         anesthesia was also reviewed. The risks and benefits                         of the procedure and the sedation options and risks                         were discussed with the patient. All questions were                         answered, and informed consent was obtained. Prior                         Anticoagulants: The patient has taken no previous                         anticoagulant or antiplatelet agents. ASA Grade                         Assessment: II - A patient with mild systemic disease.                         After reviewing the risks and benefits, the patient                         was deemed in satisfactory condition to undergo the                         procedure.  After obtaining informed consent, the colonoscope was                         passed under direct vision. Throughout the procedure,                         the patient's blood pressure, pulse, and oxygen                         saturations were monitored  continuously. The                         Colonoscope was introduced through the anus and                         advanced to the the cecum, identified by appendiceal                         orifice and ileocecal valve. The colonoscopy was                         performed without difficulty. The patient tolerated                         the procedure well. The quality of the bowel                         preparation was excellent. Findings:      The perianal and digital rectal examinations were normal.      A 5 mm polyp was found in the ascending colon. The polyp was sessile.       The polyp was removed with a cold biopsy forceps. Resection and       retrieval were complete.      A localized area of granular mucosa was found at the ileocecal valve.       Biopsies were taken with a cold forceps for histology.      A 3 mm polyp was found in the descending colon. The polyp was sessile.       The polyp was removed with a cold biopsy forceps. Resection and       retrieval were complete.      A 3 mm polyp was found in the sigmoid colon. The polyp was sessile. The       polyp was removed with a cold biopsy forceps. Resection and retrieval       were complete.      Non-bleeding internal hemorrhoids were found during retroflexion. The       hemorrhoids were Grade II (internal hemorrhoids that prolapse but reduce       spontaneously). Impression:            - One 5 mm polyp in the ascending colon, removed with                         a cold biopsy forceps. Resected and retrieved.                        - Granularity at the ileocecal valve. Biopsied.                        -  One 3 mm polyp in the descending colon, removed with                         a cold biopsy forceps. Resected and retrieved.                        - One 3 mm polyp in the sigmoid colon, removed with a                         cold biopsy forceps. Resected and retrieved.                        - Non-bleeding internal  hemorrhoids. Recommendation:        - Discharge patient to home.                        - Resume previous diet.                        - Continue present medications.                        - Await pathology results.                        - Repeat colonoscopy in 5 years for surveillance. Procedure Code(s):     --- Professional ---                        [id number], Colonoscopy, flexible; with biopsy, single or                         multiple Diagnosis Code(s):     --- Professional ---                        Z86.010, Personal history of colonic polyps                        K63.5, Polyp of colon CPT copyright 2019 American Medical Association. All rights reserved. The codes documented in this report are preliminary and upon coder review may  be revised to meet current compliance requirements. Lucilla Lame MD, MD 04/17/2021 10:24:05 AM This report has been signed electronically. Number of Addenda: 0 Note Initiated On: 04/17/2021 9:51 AM Scope Withdrawal Time: 0 hours 10 minutes 34 seconds  Total Procedure Duration: 0 hours 18 minutes 5 seconds  Estimated Blood Loss:  Estimated blood loss: none.      [facility]

## 2021-04-17 NOTE — H&P (Signed)
Vanessa Lame, MD [facility] [address], [place], [place] [address] [phone] Fax : [phone]  Primary Care Physician:  Valera Castle, MD Primary Gastroenterologist:  Dr. Allen Norris  Pre-Procedure History & Physical: HPI:  Vanessa Dougherty is a 65 y.o. female is here for an colonoscopy.   Past Medical History:  Diagnosis Date   Cancer ([place])    melanoma   Hypertension    Wears contact lenses     Past Surgical History:  Procedure Laterality Date   ABDOMINAL HYSTERECTOMY     BREAST BIOPSY Left 11/16/2016   Affirm biopsy of 2 areas- neg   CHOLECYSTECTOMY     COLONOSCOPY WITH PROPOFOL N/A 11/03/2015   Procedure: COLONOSCOPY WITH PROPOFOL;  Surgeon: Manya Silvas, MD;  Location: [facility] ENDOSCOPY;  Service: Endoscopy;  Laterality: N/A;   NASAL SINUS SURGERY      Prior to Admission medications   Medication Sig Start Date End Date Taking? Authorizing Provider  APPLE CIDER VINEGAR PO Take by mouth daily.   Yes [provider]  ibuprofen (ADVIL) 200 MG tablet Take 200 mg by mouth every 6 (six) hours as needed.   Yes [provider]  loratadine (CLARITIN) 10 MG tablet Take 10 mg by mouth daily as needed for allergies.   Yes [provider]  valsartan-hydrochlorothiazide (DIOVAN-HCT) 80-12.5 MG tablet Take 1 tablet by mouth daily.   Yes [provider]  mupirocin ointment (BACTROBAN) 2 % Apply 1 application topically 3 (three) times daily. Patient not taking: Reported on 04/07/2021 01/03/21   Laurene Footman B, PA-C    Allergies as of 03/27/2021 - Review Complete 01/03/2021  Allergen Reaction Noted   Augmentin [amoxicillin-pot clavulanate]  10/31/2015   Phenylpropanolamine  10/31/2015    Family History  Problem Relation Age of Onset   Breast cancer Neg Hx     Social History   Socioeconomic History   Marital status: Single    Spouse name: Not on file   Number of children: Not on file   Years of education: Not on file    Highest education level: Not on file  Occupational History   Not on file  Tobacco Use   Smoking status: Every Day    Packs/day: 0.50    Years: 39.00    Pack years: 19.50    Types: Cigarettes   Smokeless tobacco: Never   Tobacco comments:    Started age 48  Vaping Use   Vaping Use: Never used  Substance and Sexual Activity   Alcohol use: Yes    Alcohol/week: 3.0 standard drinks    Types: 3 Glasses of wine per week   Drug use: No   Sexual activity: Not on file  Other Topics Concern   Not on file  Social History Narrative   Not on file   Social Determinants of Health   Financial Resource Strain: Not on file  Food Insecurity: Not on file  Transportation Needs: Not on file  Physical Activity: Not on file  Stress: Not on file  Social Connections: Not on file  Intimate Partner Violence: Not on file    Review of Systems: See HPI, otherwise negative ROS  Physical Exam: BP 105/62   Pulse 97   Temp 98.2 F (36.8 C) (Temporal)   Ht 5\' 5"  (1.651 m)   Wt 72.6 kg   SpO2 97%   BMI 26.63 kg/m  General:   Alert,  pleasant and cooperative in NAD Head:  Normocephalic and atraumatic. Neck:  Supple;  no masses or thyromegaly. Lungs:  Clear throughout to auscultation.    Heart:  Regular rate and rhythm. Abdomen:  Soft, nontender and nondistended. Normal bowel sounds, without guarding, and without rebound.   Neurologic:  Alert and  oriented x4;  grossly normal neurologically.  Impression/Plan: Vanessa Dougherty is here for an colonoscopy to be performed for a history of adenomatous polyps on 2017   Risks, benefits, limitations, and alternatives regarding  colonoscopy have been reviewed with the patient.  Questions have been answered.  All parties agreeable.   Vanessa Lame, MD  04/17/2021, 9:54 AM

## 2021-04-17 NOTE — Anesthesia Preprocedure Evaluation (Signed)
Anesthesia Evaluation  Patient identified by MRN, date of birth, ID band Patient awake    Reviewed: NPO status   History of Anesthesia Complications Negative for: history of anesthetic complications  Airway Mallampati: II  TM Distance: >3 FB Neck ROM: full    Dental no notable dental hx.    Pulmonary Current Smoker and Patient abstained from smoking.,    Pulmonary exam normal        Cardiovascular Exercise Tolerance: Good hypertension, Normal cardiovascular exam     Neuro/Psych negative neurological ROS  negative psych ROS   GI/Hepatic negative GI ROS, Neg liver ROS,   Endo/Other  negative endocrine ROS  Renal/GU negative Renal ROS  negative genitourinary   Musculoskeletal   Abdominal   Peds  Hematology negative hematology ROS (+)   Anesthesia Other Findings   Reproductive/Obstetrics                             Anesthesia Physical Anesthesia Plan  ASA: 2  Anesthesia Plan: General   Post-op Pain Management:    Induction:   PONV Risk Score and Plan: Propofol infusion, TIVA and Treatment may vary due to age or medical condition  Airway Management Planned:   Additional Equipment:   Intra-op Plan:   Post-operative Plan:   Informed Consent: I have reviewed the patients History and Physical, chart, labs and discussed the procedure including the risks, benefits and alternatives for the proposed anesthesia with the patient or authorized representative who has indicated his/her understanding and acceptance.       Plan Discussed with: CRNA  Anesthesia Plan Comments:         Anesthesia Quick Evaluation

## 2021-04-17 NOTE — Anesthesia Postprocedure Evaluation (Signed)
Anesthesia Post Note  Patient: Vanessa Dougherty  Procedure(s) Performed: COLONOSCOPY WITH BIOPSY (Rectum) POLYPECTOMY (Rectum)     Patient location during evaluation: PACU Anesthesia Type: General Level of consciousness: awake and alert Pain management: pain level controlled Vital Signs Assessment: post-procedure vital signs reviewed and stable Respiratory status: spontaneous breathing, nonlabored ventilation, respiratory function stable and patient connected to nasal cannula oxygen Cardiovascular status: blood pressure returned to baseline and stable Postop Assessment: no apparent nausea or vomiting Anesthetic complications: no   No notable events documented.  Fidel Levy

## 2021-04-20 ENCOUNTER — Encounter: Payer: Self-pay | Admitting: Gastroenterology

## 2021-04-21 ENCOUNTER — Encounter: Payer: Self-pay | Admitting: Gastroenterology

## 2021-04-21 LAB — SURGICAL PATHOLOGY

## 2021-07-01 ENCOUNTER — Other Ambulatory Visit: Payer: Self-pay

## 2021-07-01 ENCOUNTER — Ambulatory Visit
Admission: EM | Admit: 2021-07-01 | Discharge: 2021-07-01 | Disposition: A | Discharge status: Home / Self Care | Payer: Managed Care, Other (non HMO)

## 2021-07-01 DIAGNOSIS — S01511A Laceration without foreign body of lip, initial encounter: Secondary | ICD-10-CM | POA: Diagnosis not present

## 2021-07-01 MED ORDER — CLINDAMYCIN HCL 300 MG PO CAPS: 300.0000 mg | ORAL_CAPSULE | Freq: Three times a day (TID) | ORAL | 0 refills | Status: AC

## 2021-07-01 NOTE — Discharge Instructions (Signed)
Take the Clindamycin three times daily with food for 10 days to prevent an infection from your lip laceration.  Rinse vigorously after meals and at bedtime with warm salt water of Listerine to prevent food particles from becoming trapped in the tissue as it heals.  Follow-up with your dentist regarding replacing your crown.  If you develop increased swelling, redness, fever, or drainage I recommend following up at [facility] ER where you can be examined by the dentist or oral surgeon on call.

## 2021-07-01 NOTE — ED Triage Notes (Signed)
Pt reports she slip and fell last night when she was getting out of the shower and cut the lower upper lip with one of her teeth. Pt states she lost the crown. Denies any injury to the head.

## 2021-07-01 NOTE — ED Provider Notes (Signed)
[facility]    CSN: [id number] Arrival date & time: 07/01/21  0943      History   Chief Complaint Chief Complaint  Patient presents with   Laceration   Fall         HPI TANISE RUSSMAN is a 66 y.o. female.   HPI  66 year old female here for evaluation of lip laceration.  Patient reports that she was out of town staying in a hotel last night when she stepped out of the shower to get her face soap and slipped getting back in the shower.  She states that she fell and struck her mouth on the edge of the tub.  She is not complaining of any neck pain and denies a loss of consciousness.  No numbness, tingling, or weakness in any of extremities.  She did sustain a laceration to the inside of her lower lip and lost a crown on her right upper incisor.  She has been in contact with her dentist who referred her to the urgent care for evaluation.  Past Medical History:  Diagnosis Date   Cancer ([place])    melanoma   Hypertension    Wears contact lenses     Patient Active Problem List   Diagnosis Date Noted   Personal history of colonic polyps    Polyp of sigmoid colon    Greater trochanteric bursitis of right hip 08/29/2017   Osteopenia 12/15/2010   Macrocytic anemia 12/15/2010   DDD (degenerative disc disease) 12/15/2010   Essential hypertension 12/15/2010    Past Surgical History:  Procedure Laterality Date   ABDOMINAL HYSTERECTOMY     BREAST BIOPSY Left 11/16/2016   Affirm biopsy of 2 areas- neg   CHOLECYSTECTOMY     COLONOSCOPY WITH PROPOFOL N/A 11/03/2015   Procedure: COLONOSCOPY WITH PROPOFOL;  Surgeon: Manya Silvas, MD;  Location: [facility];  Service: Endoscopy;  Laterality: N/A;   COLONOSCOPY WITH PROPOFOL N/A 04/17/2021   Procedure: COLONOSCOPY WITH BIOPSY;  Surgeon: Lucilla Lame, MD;  Location: [facility];  Service: Endoscopy;  Laterality: N/A;   NASAL SINUS SURGERY     POLYPECTOMY N/A 04/17/2021   Procedure: POLYPECTOMY;  Surgeon:  Lucilla Lame, MD;  Location: [facility];  Service: Endoscopy;  Laterality: N/A;    OB History   No obstetric history on file.      Home Medications    Prior to Admission medications   Medication Sig Start Date End Date Taking? Authorizing Provider  Alum Potassium (POTASSIUM ALUM) POWD by Does not apply route.   Yes [provider]  clindamycin (CLEOCIN) 300 MG capsule Take 1 capsule (300 mg total) by mouth 3 (three) times daily for 10 days. 07/01/21 07/11/21 Yes Margarette Canada, NP  APPLE CIDER VINEGAR PO Take by mouth daily.    [provider]  ibuprofen (ADVIL) 200 MG tablet Take 200 mg by mouth every 6 (six) hours as needed.    [provider]  loratadine (CLARITIN) 10 MG tablet Take 10 mg by mouth daily as needed for allergies.    [provider]  mupirocin ointment (BACTROBAN) 2 % Apply 1 application topically 3 (three) times daily. Patient not taking: Reported on 04/07/2021 01/03/21   Danton Clap, PA-C  valsartan-hydrochlorothiazide (DIOVAN-HCT) 80-12.5 MG tablet Take 1 tablet by mouth daily.    [provider]    Family History Family History  Problem Relation Age of Onset   Breast cancer Neg Hx     Social History  Social History   Tobacco Use   Smoking status: Every Day    Packs/day: 0.50    Years: 39.00    Pack years: 19.50    Types: Cigarettes   Smokeless tobacco: Never   Tobacco comments:    Started age 68  Vaping Use   Vaping Use: Never used  Substance Use Topics   Alcohol use: Yes    Alcohol/week: 3.0 standard drinks    Types: 3 Glasses of wine per week   Drug use: No     Allergies   Augmentin [amoxicillin-pot clavulanate] and Phenylpropanolamine   Review of Systems Review of Systems  Constitutional:  Negative for fever.  HENT:  Positive for dental problem and mouth sores.   Musculoskeletal:  Negative for neck pain and neck stiffness.  Skin:  Positive for wound.  Neurological:  Negative for  dizziness, syncope, weakness, numbness and headaches.    Physical Exam Triage Vital Signs ED Triage Vitals  Enc Vitals Group     BP 07/01/21 0954 117/82     Pulse Rate 07/01/21 0954 78     Resp 07/01/21 0954 18     Temp 07/01/21 0954 98.9 F (37.2 C)     Temp Source 07/01/21 0954 Oral     SpO2 07/01/21 0954 97 %     Weight --      Height --      Head Circumference --      Peak Flow --      Pain Score 07/01/21 0952 3     Pain Loc --      Pain Edu? --      Excl. in [place]? --    No data found.  Updated Vital Signs BP 117/82 (BP Location: Right Arm)    Pulse 78    Temp 98.9 F (37.2 C) (Oral)    Resp 18    SpO2 97%   Visual Acuity Right Eye Distance:   Left Eye Distance:   Bilateral Distance:    Right Eye Near:   Left Eye Near:    Bilateral Near:     Physical Exam Vitals and nursing note reviewed.  Constitutional:      General: She is not in acute distress.    Appearance: Normal appearance. She is not ill-appearing.  HENT:     Head: Normocephalic.     Mouth/Throat:     Comments: Laceration to inner lower lip. Eyes:     Extraocular Movements: Extraocular movements intact.     Pupils: Pupils are equal, round, and reactive to light.  Musculoskeletal:        General: No swelling, tenderness or deformity. Normal range of motion.  Skin:    General: Skin is warm and dry.     Capillary Refill: Capillary refill takes less than 2 seconds.     Findings: No erythema or rash.  Neurological:     General: No focal deficit present.     Mental Status: She is alert and oriented to person, place, and time.     Cranial Nerves: No cranial nerve deficit.     Sensory: No sensory deficit.     Motor: No weakness.  Psychiatric:        Mood and Affect: Mood normal.        Behavior: Behavior normal.        Thought Content: Thought content normal.        Judgment: Judgment normal.     UC Treatments / Results  Labs (all labs  ordered are listed, but only abnormal results are  displayed) Labs Reviewed - No data to display  EKG   Radiology No results found.  Procedures Procedures (including critical care time)  Medications Ordered in UC Medications - No data to display  Initial Impression / Assessment and Plan / UC Course  I have reviewed the triage vital signs and the nursing notes.  Pertinent labs & imaging results that were available during my care of the patient were reviewed by me and considered in my medical decision making (see chart for details).  Nontoxic-appearing 66 year old female here for evaluation of lower lip laceration that she sustained last night from a fall in the shower.  There is no associated loss of consciousness and patient denies any neck pain, headache, numbness, ting, weakness or any of her extremities.  She has full range of motion of her extremities, neck, and torso.  On physical exam her cervical spine is in normal anatomical alignment and she does not have any tenderness with palpation of the spinous processes of the cervical spine.  She also denies any paraspinous tenderness and there is no spasm or edema noted.  Patient's bilateral grips and upper extremity strength are 5/5.  When examining her mouth she has approximately 3 cm laceration on the inside of her lower lip.  This does not extend through to the outside though there is a small scabbed abrasion linearly in the same area.  There is no active bleeding from the wound.  Her lower teeth are all stable and nontender.  Her upper tooth shows a missing crown at her right incisor.  There is no tenderness with palpation of her upper teeth and the alveolar ridge is intact.  No ecchymosis noted.  I advised the patient that we do not sew up lacerations inside the mouth as they are prone to become infected if we do so.  She is scared to take penicillin as her mom died of an allergic reaction to penicillin and her husband is also allergic to penicillin.  I will place her on clindamycin 300 mg  3 times daily for 10 days to prevent infection.  I have advised her to rinse vigorously with warm salt water or Listerine following meals and at bedtime to ensure that no food particles become trapped in the tissue as it heals which could lead to infection.  She has been in contact with her dentist in regards to getting her crown replaced.  I have advised the patient that if she has any increase in swelling, redness, heat, or drainage from her wound that she should follow-up with her dentist or an oral surgeon.  I have given her resources to [facility] where she could be evaluated by the dentist or oral surgeon on-call.  Patient denies any need for work note.   Final Clinical Impressions(s) / UC Diagnoses   Final diagnoses:  Lip laceration, initial encounter     Discharge Instructions      Take the Clindamycin three times daily with food for 10 days to prevent an infection from your lip laceration.  Rinse vigorously after meals and at bedtime with warm salt water of Listerine to prevent food particles from becoming trapped in the tissue as it heals.  Follow-up with your dentist regarding replacing your crown.  If you develop increased swelling, redness, fever, or drainage I recommend following up at [facility] ER where you can be examined by the dentist or oral surgeon on call.  ED Prescriptions     Medication Sig Dispense Auth. Provider   clindamycin (CLEOCIN) 300 MG capsule Take 1 capsule (300 mg total) by mouth 3 (three) times daily for 10 days. 30 capsule Margarette Canada, NP      PDMP not reviewed this encounter.   Margarette Canada, NP 07/01/21 1020

## 2022-03-22 ENCOUNTER — Other Ambulatory Visit: Payer: Self-pay | Admitting: Family Medicine

## 2022-03-22 DIAGNOSIS — Z780 Asymptomatic menopausal state: Secondary | ICD-10-CM

## 2022-04-09 ENCOUNTER — Other Ambulatory Visit: Payer: Self-pay | Admitting: Family Medicine

## 2022-04-09 DIAGNOSIS — Z1231 Encounter for screening mammogram for malignant neoplasm of breast: Secondary | ICD-10-CM

## 2022-04-17 IMAGING — MG MM DIGITAL SCREENING BILAT W/ TOMO AND CAD
8 series · 8 of 24 positions shown · non-contrast
Comparison: Previous exam(s).

CLINICAL DATA: Screening.

EXAM:
DIGITAL SCREENING BILATERAL MAMMOGRAM WITH TOMOSYNTHESIS AND CAD
TECHNIQUE: Bilateral screening digital craniocaudal and mediolateral oblique
mammograms were obtained. Bilateral screening digital breast
tomosynthesis was performed. The images were evaluated with
computer-aided detection.

[R CC synth-2D]
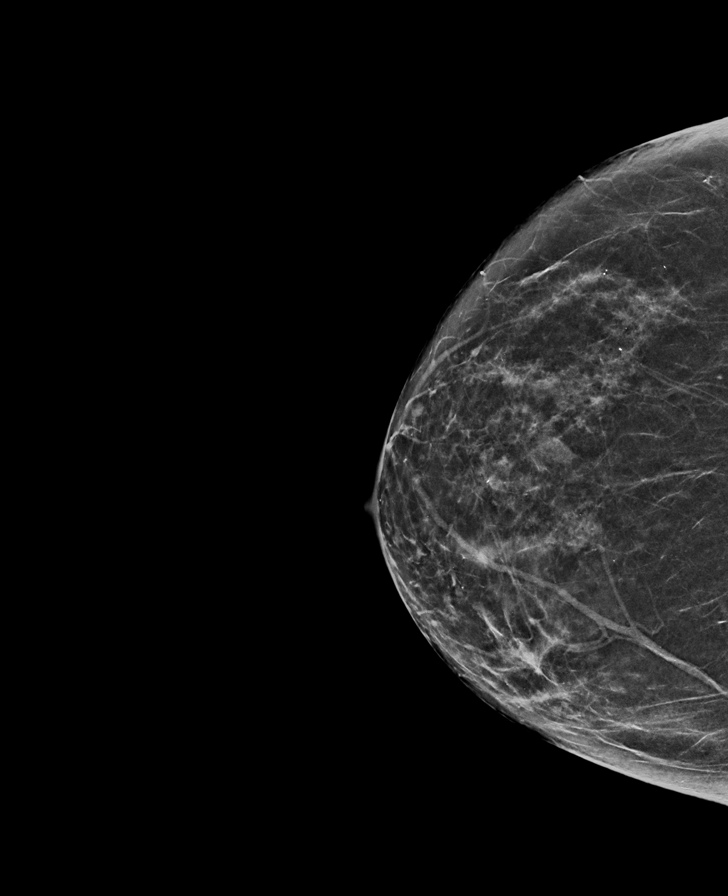

[L MLO synth-2D]
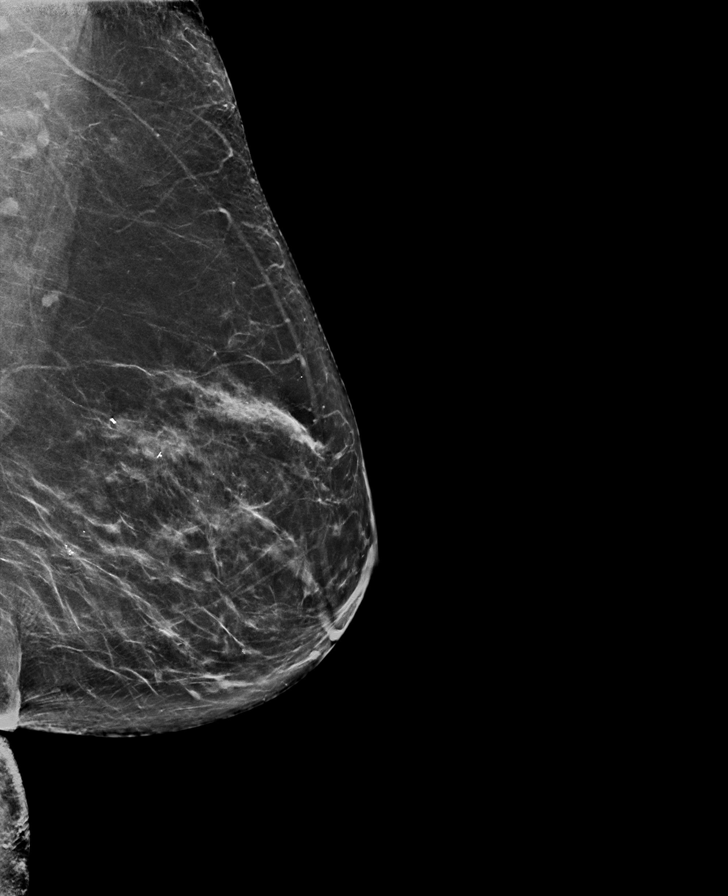

[R MLO synth-2D]
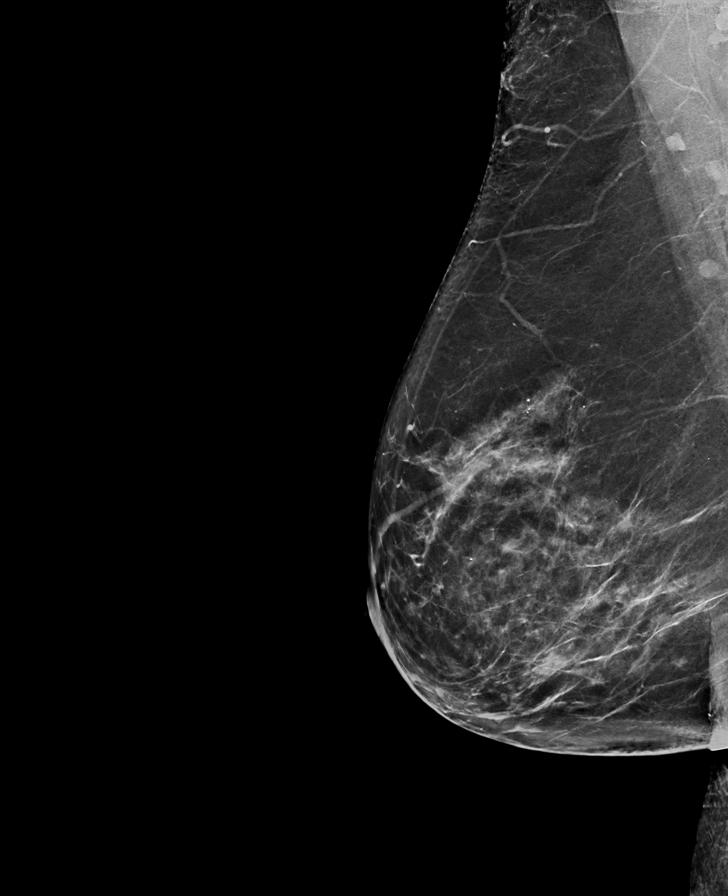

[L CC synth-2D]
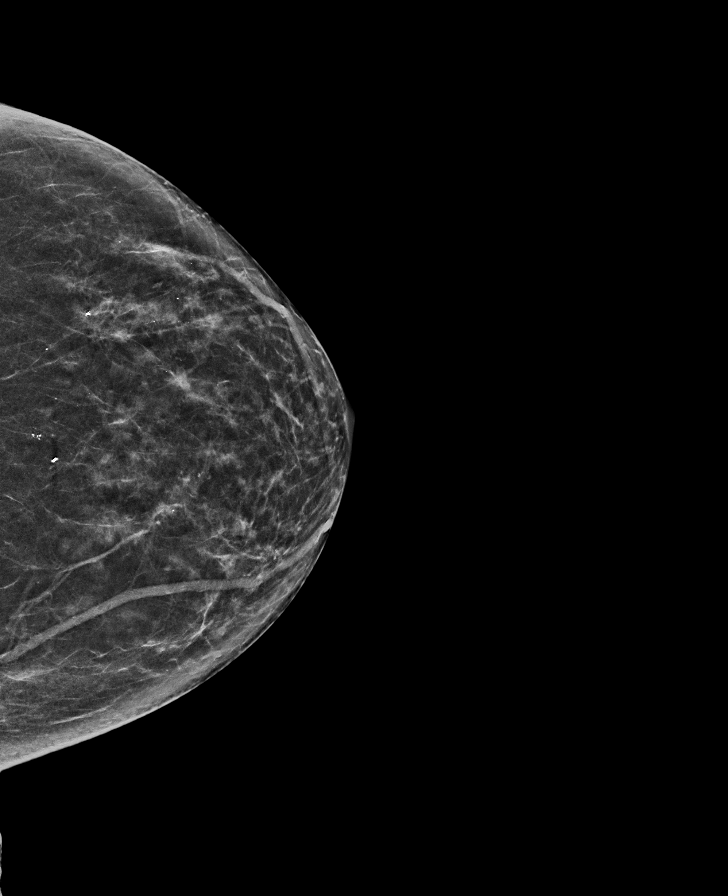

[L CC tomo · tomo slice 31/61.0]
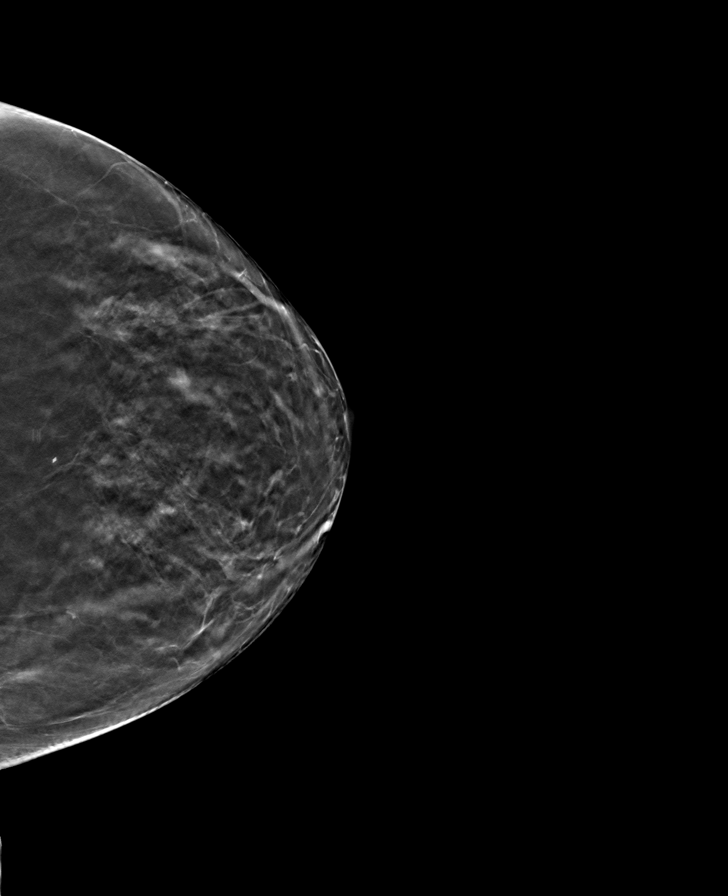

[R MLO tomo · tomo slice 37/74.0]
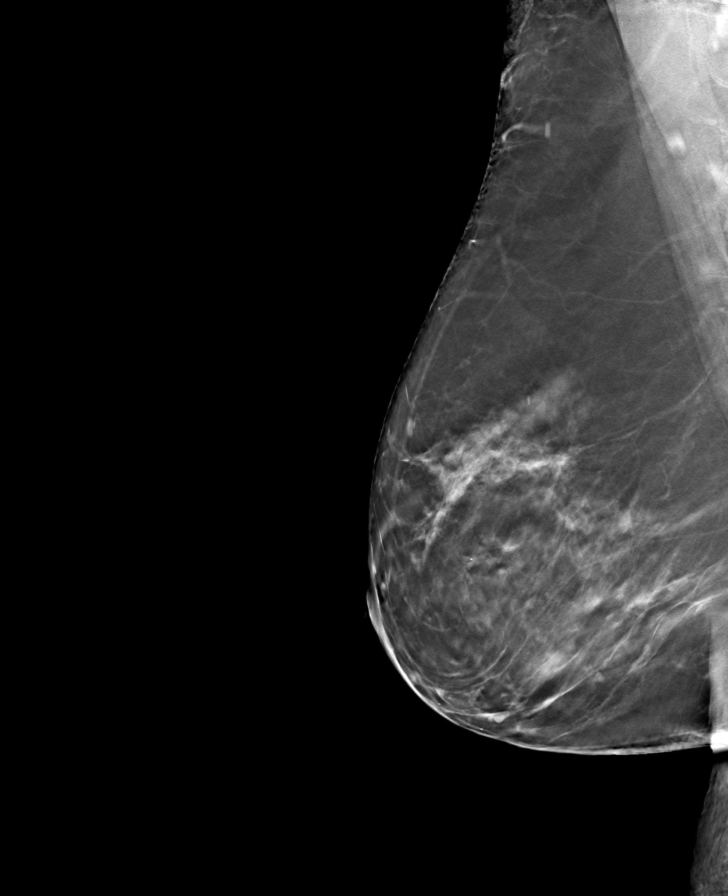

[R CC tomo · tomo slice 31/61.0]
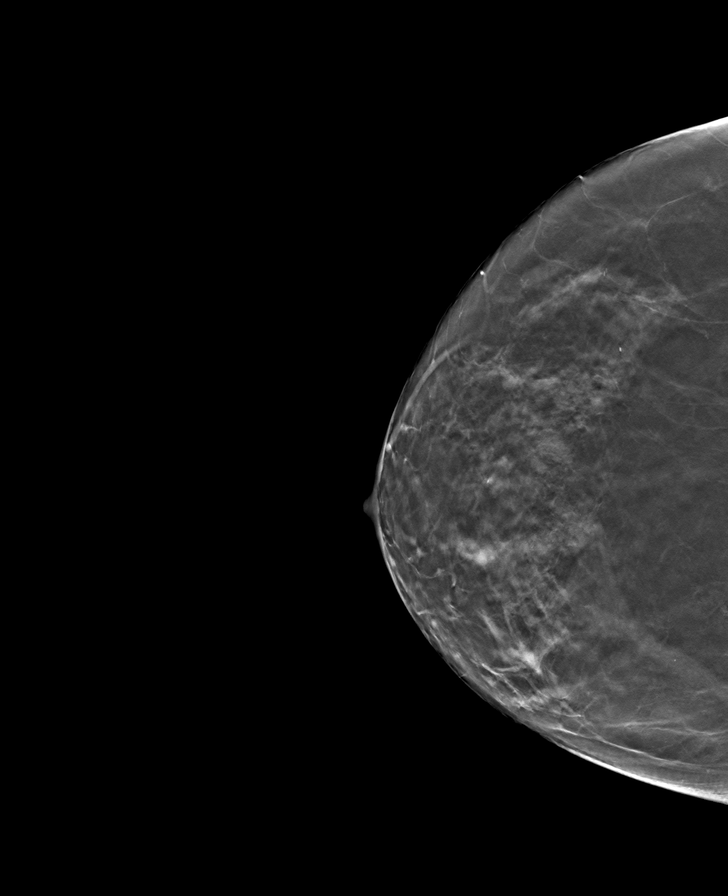

[L MLO tomo · tomo slice 35/70.0]
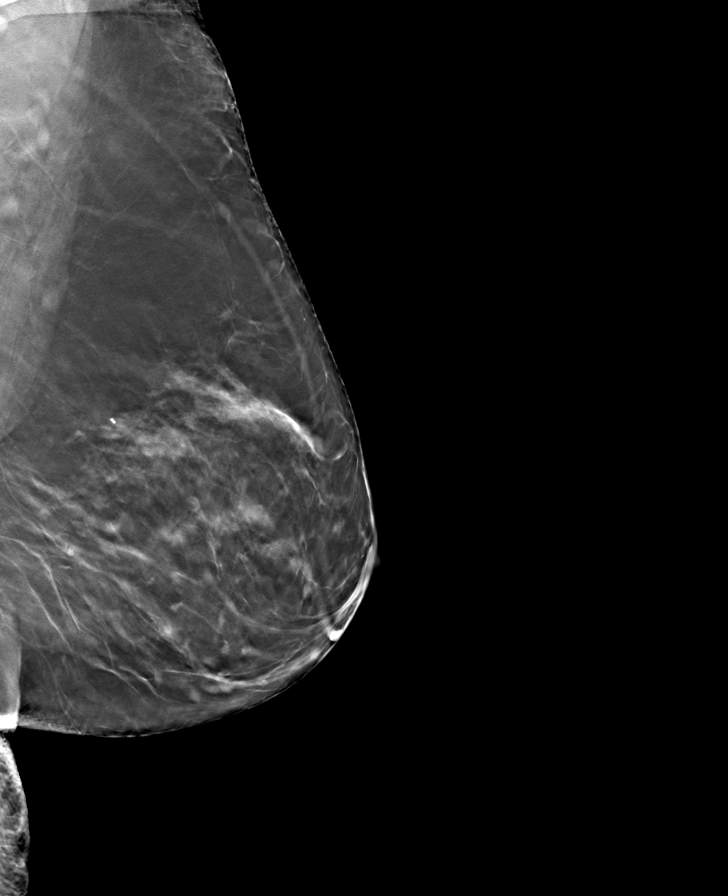

[8 of 24 positions shown; findings below may reference images not displayed]

ACR Breast Density Category c: The breast tissue is heterogeneously
dense, which may obscure small masses.
FINDINGS: There are no findings suspicious for malignancy.
IMPRESSION: No mammographic evidence of malignancy. A result letter of this
screening mammogram will be mailed directly to the patient.

RECOMMENDATION:
Screening mammogram in one year. (Code:Q3-W-BC3)

BI-RADS CATEGORY  1: Negative.

## 2022-05-12 ENCOUNTER — Ambulatory Visit
Admission: RE | Admit: 2022-05-12 | Discharge: 2022-05-12 | Disposition: A | Discharge status: Home / Self Care | Payer: Managed Care, Other (non HMO) | Source: Ambulatory Visit | Attending: Family Medicine | Admitting: Family Medicine

## 2022-05-12 DIAGNOSIS — Z1231 Encounter for screening mammogram for malignant neoplasm of breast: Secondary | ICD-10-CM | POA: Insufficient documentation

## 2022-05-12 DIAGNOSIS — Z780 Asymptomatic menopausal state: Secondary | ICD-10-CM | POA: Diagnosis present

## 2023-10-26 ENCOUNTER — Other Ambulatory Visit: Payer: Self-pay | Admitting: Family Medicine

## 2023-10-26 DIAGNOSIS — Z87891 Personal history of nicotine dependence: Secondary | ICD-10-CM

## 2023-10-26 DIAGNOSIS — R634 Abnormal weight loss: Secondary | ICD-10-CM

## 2023-10-28 ENCOUNTER — Ambulatory Visit
Admission: RE | Admit: 2023-10-28 | Discharge: 2023-10-28 | Disposition: A | Discharge status: Home / Self Care | Payer: Self-pay | Source: Ambulatory Visit | Attending: Family Medicine | Admitting: Family Medicine

## 2023-10-28 DIAGNOSIS — R634 Abnormal weight loss: Secondary | ICD-10-CM | POA: Insufficient documentation

## 2023-10-28 DIAGNOSIS — Z87891 Personal history of nicotine dependence: Secondary | ICD-10-CM | POA: Insufficient documentation

## 2023-10-28 MED ORDER — IOHEXOL 300 MG/ML  SOLN
85.0000 mL | Freq: Once | INTRAMUSCULAR | Status: AC | PRN
Start: 2023-10-28 — End: 2023-10-28
  Administered 2023-10-28: 85 mL via INTRAVENOUS
